# Patient Record
Sex: Male | Born: 1980 | Race: White | Hispanic: No | Marital: Single | Smoking: Current every day smoker
Health system: Southern US, Community
[De-identification: ages and names within clinical notes are randomized; demographics above are authoritative.]

## PROBLEM LIST (undated history)

## (undated) DIAGNOSIS — I10 Essential (primary) hypertension: Secondary | ICD-10-CM

## (undated) DIAGNOSIS — I7121 Aneurysm of the ascending aorta, without rupture: Secondary | ICD-10-CM

## (undated) HISTORY — DX: Essential (primary) hypertension: I10

## (undated) HISTORY — DX: Aneurysm of the ascending aorta, without rupture: I71.21

## (undated) HISTORY — PX: KNEE ARTHROSCOPY: SUR90

---
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
## 2001-03-06 ENCOUNTER — Ambulatory Visit (HOSPITAL_COMMUNITY): Admission: RE | Admit: 2001-03-06 | Discharge: 2001-03-06 | Payer: Self-pay | Admitting: Family Medicine

## 2001-03-06 ENCOUNTER — Encounter: Payer: Self-pay | Admitting: Family Medicine

## 2001-03-10 ENCOUNTER — Encounter: Payer: Self-pay | Admitting: Family Medicine

## 2001-03-10 ENCOUNTER — Encounter: Admission: RE | Admit: 2001-03-10 | Discharge: 2001-03-10 | Payer: Self-pay | Admitting: Family Medicine

## 2014-03-03 ENCOUNTER — Encounter (HOSPITAL_COMMUNITY): Payer: Self-pay | Admitting: Emergency Medicine

## 2014-03-03 ENCOUNTER — Emergency Department (HOSPITAL_COMMUNITY)
Admission: EM | Admit: 2014-03-03 | Discharge: 2014-03-03 | Disposition: A | Discharge status: Home / Self Care | Payer: Self-pay | Attending: Emergency Medicine | Admitting: Emergency Medicine

## 2014-03-03 DIAGNOSIS — H579 Unspecified disorder of eye and adnexa: Secondary | ICD-10-CM | POA: Insufficient documentation

## 2014-03-03 DIAGNOSIS — H5789 Other specified disorders of eye and adnexa: Secondary | ICD-10-CM | POA: Insufficient documentation

## 2014-03-03 DIAGNOSIS — L237 Allergic contact dermatitis due to plants, except food: Secondary | ICD-10-CM

## 2014-03-03 DIAGNOSIS — L255 Unspecified contact dermatitis due to plants, except food: Secondary | ICD-10-CM | POA: Insufficient documentation

## 2014-03-03 DIAGNOSIS — Z87891 Personal history of nicotine dependence: Secondary | ICD-10-CM | POA: Insufficient documentation

## 2014-03-03 MED ORDER — FAMOTIDINE 20 MG PO TABS: 20.0000 mg | ORAL_TABLET | Freq: Two times a day (BID) | ORAL | Status: DC

## 2014-03-03 MED ORDER — DIPHENHYDRAMINE HCL 25 MG PO TABS: 25.0000 mg | ORAL_TABLET | Freq: Four times a day (QID) | ORAL | Status: DC

## 2014-03-03 MED ORDER — PREDNISONE 20 MG PO TABS
60.0000 mg | ORAL_TABLET | Freq: Once | ORAL | Status: AC
  Administered 2014-03-03: 60 mg via ORAL
  Filled 2014-03-03: qty 3

## 2014-03-03 MED ORDER — PREDNISONE 20 MG PO TABS: 60.0000 mg | ORAL_TABLET | Freq: Every day | ORAL | Status: DC

## 2014-03-03 NOTE — Discharge Instructions (Signed)
Poison Ivy Poison ivy is a inflammation of the skin (contact dermatitis) caused by touching the allergens on the leaves of the ivy plant following previous exposure to the plant. The rash usually appears 48 hours after exposure. The rash is usually bumps (papules) or blisters (vesicles) in a linear pattern. Depending on your own sensitivity, the rash may simply cause redness and itching, or it may also progress to blisters which may break open. These must be well cared for to prevent secondary bacterial (germ) infection, followed by scarring. Keep any open areas dry, clean, dressed, and covered with an antibacterial ointment if needed. The eyes may also get puffy. The puffiness is worst in the morning and gets better as the day progresses. This dermatitis usually heals without scarring, within 2 to 3 weeks without treatment. HOME CARE INSTRUCTIONS  Thoroughly wash with soap and water as soon as you have been exposed to poison ivy. You have about one half hour to remove the plant resin before it will cause the rash. This washing will destroy the oil or antigen on the skin that is causing, or will cause, the rash. Be sure to wash under your fingernails as any plant resin there will continue to spread the rash. Do not rub skin vigorously when washing affected area. Poison ivy cannot spread if no oil from the plant remains on your body. A rash that has progressed to weeping sores will not spread the rash unless you have not washed thoroughly. It is also important to wash any clothes you have been wearing as these may carry active allergens. The rash will return if you wear the unwashed clothing, even several days later. Avoidance of the plant in the future is the best measure. Poison ivy plant can be recognized by the number of leaves. Generally, poison ivy has three leaves with flowering branches on a single stem. Diphenhydramine may be purchased over the counter and used as needed for itching. Do not drive with  this medication if it makes you drowsy.Ask your caregiver about medication for children. SEEK MEDICAL CARE IF:  Open sores develop.  Redness spreads beyond area of rash.  You notice purulent (pus-like) discharge.  You have increased pain.  Other signs of infection develop (such as fever). Document Released: 12/07/2000 Document Revised: 03/03/2012 Document Reviewed: 10/26/2009 ExitCare Patient Information 2014 [employer].  

## 2014-03-03 NOTE — ED Provider Notes (Signed)
CSN: [id number]     Arrival date & time 03/03/14  1048 History   First MD Initiated Contact with Patient 03/03/14 1058     Chief Complaint  Patient presents with  . Poison Ivy     (Consider location/radiation/quality/duration/timing/severity/associated sxs/prior Treatment) HPI Comments: Patient here today for poison ivy rash that appeared yesterday. Patient had chased his puppy through area with poison ivy, then picked up puppy which is how he speculates the rash spread to his face. He is now complaining of rash on arms bilaterally, upper back, neck, and face. When he awoke this morning his left eyelid was swollen shut. He has taken benadryl and used an anti-pruritic cream with little relief. He also stated he has not removed his contacts since the event for fear the rash might spread into eye. Denies fever, SOB, swelling of throat, nausea, and vomiting.   Patient is a 33 y.o. male presenting with poison ivy. The history is provided by the patient.  Poison Lajoyce Cornersvy This is a new problem. The current episode started yesterday. The problem has been gradually worsening. Associated symptoms include a rash. Pertinent negatives include no fever, nausea, sore throat or vomiting.    History reviewed. No pertinent past medical history. History reviewed. No pertinent past surgical history. No family history on file. History  Substance Use Topics  . Smoking status: Former Games developermoker  . Smokeless tobacco: Not on file  . Alcohol Use: No    Review of Systems  Constitutional: Negative for fever.  HENT: Negative for sore throat and trouble swallowing.   Eyes: Positive for redness and itching.       Itching and erythema limited to bilateral eyelids.   Respiratory: Negative for shortness of breath.   Gastrointestinal: Negative for nausea and vomiting.  Skin: Positive for rash.      Allergies  Poison ivy extract  Home Medications   Current Outpatient Rx  Name  Route  Sig  Dispense  Refill  .  Diphenhyd-Calamine-Benzyl Alc (IVAREST POISON IVY ITCH) 02-06-09.5 % CREA   Apply externally   Apply 1 application topically as needed (For poison ivy).         . diphenhydrAMINE (BENADRYL) 25 mg capsule   Oral   Take 25 mg by mouth every 4 (four) hours as needed for itching.          BP 122/83  Pulse 83  Temp(Src) 98 F (36.7 C) (Oral)  Resp 16  Wt 220 lb (99.791 kg)  SpO2 99% Physical Exam  Constitutional: He is oriented to person, place, and time. He appears well-developed and well-nourished. No distress.  HENT:  Mouth/Throat: Oropharynx is clear and moist.  Eyes: Conjunctivae are normal. Right eye exhibits no discharge. Left eye exhibits no discharge.  Cardiovascular: Normal rate and regular rhythm.   Pulmonary/Chest: Effort normal.  Neurological: He is alert and oriented to person, place, and time.  Skin: Skin is warm and dry. Rash noted. He is not diaphoretic.  Erythematous maculopapular rash to arms bilaterally, upper back, dorsal aspect of neck and face.    ED Course  Procedures (including critical care time) Labs Review Labs Reviewed - No data to display Imaging Review No results found.   EKG Interpretation None      MDM   Final diagnoses:  None    1. Poison Ivy  Contact dermatitis complicated by facial involvement. Will Rx oral steroids, continue benadryl. Return for worsening symptoms or eye symptoms.     Arnoldo HookerShari A Milani Lowenstein, PA-C  03/06/14 1058 

## 2014-03-03 NOTE — Progress Notes (Signed)
[facility] provided pt with a list of primary care resources. Patient stated that he was pending insurance through job.  °

## 2014-03-07 NOTE — ED Provider Notes (Signed)
Medical screening examination/treatment/procedure(s) were performed by non-physician practitioner and as supervising physician I was immediately available for consultation/collaboration.   EKG Interpretation None        Samayra Hebel, MD 03/07/14 0701 

## 2017-03-05 ENCOUNTER — Ambulatory Visit (INDEPENDENT_AMBULATORY_CARE_PROVIDER_SITE_OTHER): Payer: Self-pay

## 2017-03-05 ENCOUNTER — Other Ambulatory Visit: Payer: Self-pay | Admitting: Emergency Medicine

## 2017-03-05 DIAGNOSIS — M545 Low back pain: Secondary | ICD-10-CM

## 2017-03-05 DIAGNOSIS — M5431 Sciatica, right side: Secondary | ICD-10-CM

## 2018-01-15 DIAGNOSIS — J309 Allergic rhinitis, unspecified: Secondary | ICD-10-CM | POA: Diagnosis not present

## 2018-03-06 ENCOUNTER — Ambulatory Visit: Payer: BLUE CROSS/BLUE SHIELD | Admitting: Allergy & Immunology

## 2018-04-24 ENCOUNTER — Encounter: Payer: Self-pay | Admitting: Allergy & Immunology

## 2018-04-24 ENCOUNTER — Ambulatory Visit (INDEPENDENT_AMBULATORY_CARE_PROVIDER_SITE_OTHER): Payer: BLUE CROSS/BLUE SHIELD | Admitting: Allergy & Immunology

## 2018-04-24 VITALS — BP 130/90 | HR 76 | Temp 98.0°F | Resp 16 | Ht 70.5 in | Wt 241.0 lb

## 2018-04-24 DIAGNOSIS — J310 Chronic rhinitis: Secondary | ICD-10-CM

## 2018-04-24 MED ORDER — FLUTICASONE PROPIONATE 93 MCG/ACT NA EXHU: 2.0000 | INHALANT_SUSPENSION | Freq: Two times a day (BID) | NASAL | 5 refills | Status: DC

## 2018-04-24 MED ORDER — MONTELUKAST SODIUM 10 MG PO TABS: 10.0000 mg | ORAL_TABLET | Freq: Every day | ORAL | 5 refills | Status: AC

## 2018-04-24 NOTE — Patient Instructions (Addendum)
1. Chronic rhinitis - We will get blood testing to check on your allergies.  - We will call you in 1-2 weeks with the results, and we can start allergy shots at that point. - We may need to do skin testing to augment the blood testing, but we will see on that.  - Stop taking: Claritin-D (this is one of the weakest antihistamines) and Nasacort - Start taking: Xyzal (levocetirizine)  tablet once daily, Singulair (montelukast)  daily and Xhance (fluticasone) 1-2 sprays per nostril twice daily  - We will send in the script to the [facility] outpatient pharmacy, and they will call you to confirm your shipping address. - Ask to be enrolled in the auto-refill program so you can get a year for free.  - You can use an extra dose of the antihistamine, if needed, for breakthrough symptoms.  - Consider nasal saline rinses 1-2 times daily to remove allergens from the nasal cavities as well as help with mucous clearance (this is especially helpful to do before the nasal sprays are given) - Consider allergy shots as a means of long-term control. - Allergy shots "re-train" and "reset" the immune system to ignore environmental allergens and decrease the resulting immune response to those allergens (sneezing, itchy watery eyes, runny nose, nasal congestion, etc).    - Allergy shot consent signed today.   2. Return in about 3 months (around 07/25/2018).   Please inform us of any Emergency Department visits, hospitalizations, or changes in symptoms. Call us before going to the ED for breathing or allergy symptoms since we might be able to fit you in for a sick visit. Feel free to contact us anytime with any questions, problems, or concerns.  It was a pleasure to meet you today!  Websites that have reliable patient information: 1. American Academy of Asthma, Allergy, and Immunology: www.aaaai.org 2. Food Allergy Research and Education (FARE): foodallergy.org 3. Mothers of Asthmatics:  http://www.asthmacommunitynetwork.org 4. American College of Allergy, Asthma, and Immunology: www.acaai.org   Allergy Shots   Allergies are the result of a chain reaction that starts in the immune system. Your immune system controls how your body defends itself. For instance, if you have an allergy to pollen, your immune system identifies pollen as an invader or allergen. Your immune system overreacts by producing antibodies called Immunoglobulin E (IgE). These antibodies travel to cells that release chemicals, causing an allergic reaction.  The concept behind allergy immunotherapy, whether it is received in the form of shots or tablets, is that the immune system can be desensitized to specific allergens that trigger allergy symptoms. Although it requires time and patience, the payback can be long-term relief.  How Do Allergy Shots Work?  Allergy shots work much like a vaccine. Your body responds to injected amounts of a particular allergen given in increasing doses, eventually developing a resistance and tolerance to it. Allergy shots can lead to decreased, minimal or no allergy symptoms.  There generally are two phases: build-up and maintenance. Build-up often ranges from three to six months and involves receiving injections with increasing amounts of the allergens. The shots are typically given once or twice a week, though more rapid build-up schedules are sometimes used.  The maintenance phase begins when the most effective dose is reached. This dose is different for each person, depending on how allergic you are and your response to the build-up injections. Once the maintenance dose is reached, there are longer periods between injections, typically two to four weeks.  Occasionally doctors  give cortisone-type shots that can temporarily reduce allergy symptoms. These types of shots are different and should not be confused with allergy immunotherapy shots.  Who Can Be Treated with Allergy  Shots?  Allergy shots may be a good treatment approach for people with allergic rhinitis (hay fever), allergic asthma, conjunctivitis (eye allergy) or stinging insect allergy.   Before deciding to begin allergy shots, you should consider:  . The length of allergy season and the severity of your symptoms . Whether medications and/or changes to your environment can control your symptoms . Your desire to avoid long-term medication use . Time: allergy immunotherapy requires a major time commitment . Cost: may vary depending on your insurance coverage  Allergy shots for children age 34 and older are effective and often well tolerated. They might prevent the onset of new allergen sensitivities or the progression to asthma.  Allergy shots are not started on patients who are pregnant but can be continued on patients who become pregnant while receiving them. In some patients with other medical conditions or who take certain common medications, allergy shots may be of risk. It is important to mention other medications you talk to your allergist.   When Will I Feel Better?  Some may experience decreased allergy symptoms during the build-up phase. For others, it may take as long as 12 months on the maintenance dose. If there is no improvement after a year of maintenance, your allergist will discuss other treatment options with you.  If you aren't responding to allergy shots, it may be because there is not enough dose of the allergen in your vaccine or there are missing allergens that were not identified during your allergy testing. Other reasons could be that there are high levels of the allergen in your environment or major exposure to non-allergic triggers like tobacco smoke.  What Is the Length of Treatment?  Once the maintenance dose is reached, allergy shots are generally continued for three to five years. The decision to stop should be discussed with your allergist at that time. Some people may  experience a permanent reduction of allergy symptoms. Others may relapse and a longer course of allergy shots can be considered.  What Are the Possible Reactions?  The two types of adverse reactions that can occur with allergy shots are local and systemic. Common local reactions include very mild redness and swelling at the injection site, which can happen immediately or several hours after. A systemic reaction, which is less common, affects the entire body or a particular body system. They are usually mild and typically respond quickly to medications. Signs include increased allergy symptoms such as sneezing, a stuffy nose or hives.  Rarely, a serious systemic reaction called anaphylaxis can develop. Symptoms include swelling in the throat, wheezing, a feeling of tightness in the chest, nausea or dizziness. Most serious systemic reactions develop within 30 minutes of allergy shots. This is why it is strongly recommended you wait in your doctor's office for 30 minutes after your injections. Your allergist is trained to watch for reactions, and his or her staff is trained and equipped with the proper medications to identify and treat them.  Who Should Administer Allergy Shots?  The preferred location for receiving shots is your prescribing allergist's office. Injections can sometimes be given at another facility where the physician and staff are trained to recognize and treat reactions, and have received instructions by your prescribing allergist.

## 2018-04-24 NOTE — Progress Notes (Signed)
NEW PATIENT  Date of Service/Encounter:  04/24/18  Referring provider: Hoyt Koch, MD   Assessment:   Chronic rhinitis  Plan/Recommendations:   1. Chronic rhinitis - We will get blood testing to check on your allergies since you took your loratadine last night.  - We will call you in 1-2 weeks with the results, and we can start allergy shots at that point. - We may need to do skin testing to augment the blood testing, but we will see on that.  - Stop taking: Claritin-D (this is one of the weakest antihistamines) and Nasacort - Start taking: Xyzal (levocetirizine)  tablet once daily, Singulair (montelukast)  daily and Xhance (fluticasone) 1-2 sprays per nostril twice daily  - We will send in the script to the [facility] outpatient pharmacy, and they will call you to confirm your shipping address. - Ask to be enrolled in the auto-refill program so you can get a year for free.  - You can use an extra dose of the antihistamine, if needed, for breakthrough symptoms.  - Consider nasal saline rinses 1-2 times daily to remove allergens from the nasal cavities as well as help with mucous clearance (this is especially helpful to do before the nasal sprays are given) - Consider allergy shots as a means of long-term control. - Allergy shots "re-train" and "reset" the immune system to ignore environmental allergens and decrease the resulting immune response to those allergens (sneezing, itchy watery eyes, runny nose, nasal congestion, etc).    - Allergy shot consent signed today.   2. Return in about 3 months (around 07/25/2018).   Subjective:   Nicholas Fry is a 36 y.o. male presenting today for evaluation of  Chief Complaint  Patient presents with  . Allergic Rhinitis   . Itchy Eye    discharge  . Sinusitis    Nicholas Fry has a history of the following: Patient Active Problem List   Diagnosis Date Noted  . Chronic rhinitis 04/24/2018    History obtained from:  chart review and patient.  Nicholas Fry was referred by Hoyt Koch, MD.     Nicholas Fry is a 37 y.o. male presenting for an allergy evaluation. He started having symptoms 23 years ago when he moved here. He moved from [place]. He moved here when he was 37 years old, and he started having symptoms nearly immediately after he arrived. This is when he first came to our practice for allergy testing in the late 1990s. He is unsure of the results at that time.  In any case, overall symptoms have gradually been getting worse. 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 He does notice that he was completely asymptomatic when he is at the beach, but symptoms return within hours of coming back from the beach. He endorses itchy watery eyes. The right one has been worse lately, but overall they have affected both eyes. He also reports chronic nasal congestion. He forgot about the three days of being off of the antihistamines, and unfortunately he took a Claritin last night.   Nicholas Fry typically uses the Claritin-D. He jumps on and off of this D component depending on the severity. He does have Nasacort which he does not use routinely. He wears contacts so he does not use eye drops. He has been on Singulair for years, but he does not remember whether it worked. He has tried a mutltidue of  different medications. He is sometimes placed on antibiotics; he estimates that he gets around two rounds per year.  Otherwise, there is no history of other atopic diseases, including asthma, drug allergies, food allergies, stinging insect allergies, or urticaria. There is no significant infectious history. Vaccinations are up to date.    Past Medical History: Patient Active Problem List   Diagnosis Date Noted  . Chronic rhinitis 04/24/2018    Medication List:  Allergies as of 04/24/2018      Reactions   Poison Ivy Extract [poison Ivy  Extract] Hives, Rash      Medication List        Accurate as of 04/24/18 11:05 PM. Always use your most recent med list.          Fluticasone Propionate 93 MCG/ACT Exhu Commonly known as:  XHANCE Place 2 sprays into the nose 2 (two) times daily.   loratadine-pseudoephedrine 10-240 MG 24 hr tablet Commonly known as:  CLARITIN-D 24-hour Take 1 tablet by mouth daily.   montelukast 10 MG tablet Commonly known as:  SINGULAIR Take 1 tablet (10 mg total) by mouth at bedtime.   NASACORT ALLERGY 24HR 55 MCG/ACT Aero nasal inhaler Generic drug:  triamcinolone Place 2 sprays into the nose daily.       Birth History: non-contributory.  Developmental History: non-contributory.   Past Surgical History: History reviewed. No pertinent surgical history.   Family History: Family History  Problem Relation Age of Onset  . Allergic rhinitis Sister   . Asthma Neg Hx   . Angioedema Neg Hx   . Urticaria Neg Hx   . Eczema Neg Hx      Social History: Deandrae lives at home with his family. He works as a Naval architect. He is an occasional smoker, but not a heavy drinker. There are dogs in the home.      Review of Systems: a 14-point review of systems is pertinent for what is mentioned in HPI.  Otherwise, all other systems were negative. Constitutional: negative other than that listed in the HPI Eyes: negative other than that listed in the HPI Ears, nose, mouth, throat, and face: negative other than that listed in the HPI Respiratory: negative other than that listed in the HPI Cardiovascular: negative other than that listed in the HPI Gastrointestinal: negative other than that listed in the HPI Genitourinary: negative other than that listed in the HPI Integument: negative other than that listed in the HPI Hematologic: negative other than that listed in the HPI Musculoskeletal: negative other than that listed in the HPI Neurological: negative other than that listed in the  HPI Allergy/Immunologic: negative other than that listed in the HPI    Objective:   Blood pressure 130/90, pulse 76, temperature 98 F (36.7 C), temperature source Oral, resp. rate 16, height 5' 10.5" (1.791 m), weight 241 lb (109.3 kg). Body mass index is 34.09 kg/m.   Physical Exam:  General: Alert, interactive, in no acute distress. Pleasant male.  Eyes: No conjunctival injection bilaterally, no discharge on the right, no discharge on the left, no Horner-Trantas dots present and allergic shiners present bilaterally. PERRL bilaterally. EOMI without pain. No photophobia.  Ears: Right TM pearly gray with normal light reflex, Left TM pearly gray with normal light reflex, Right TM intact without perforation and Left TM intact without perforation.  Nose/Throat: External nose within normal limits, nasal crease present and septum midline. Turbinates markedly edematous and pale with clear discharge. Posterior oropharynx markedly erythematous with cobblestoning in the  posterior oropharynx. Tonsils 3+ without exudates.  Tongue without thrush. Neck: Supple without thyromegaly. Trachea midline. Adenopathy: no enlarged lymph nodes appreciated in the anterior cervical, occipital, axillary, epitrochlear, inguinal, or popliteal regions. Lungs: Clear to auscultation without wheezing, rhonchi or rales. No increased work of breathing. CV: Normal S1/S2. No murmurs. Capillary refill <2 seconds.  Abdomen: Nondistended, nontender. No guarding or rebound tenderness. Bowel sounds present in all fields and hypoactive  Skin: Warm and dry, without lesions or rashes. Extremities:  No clubbing, cyanosis or edema. Neuro:   Grossly intact. No focal deficits appreciated. Responsive to questions.  Diagnostic studies: deferred due to recent antihistamine use     Malachi Bonds, MD [facility]

## 2018-04-29 ENCOUNTER — Telehealth: Payer: Self-pay | Admitting: Allergy & Immunology

## 2018-04-29 NOTE — Telephone Encounter (Signed)
Patient informed and advised of medication recommendations. Will let us know if that works well or not.

## 2018-04-29 NOTE — Telephone Encounter (Signed)
Patient is calling about issues with medications XYZAL and MONTELUKAST 1)) patient cannot take medications at the same time daily or take them daily due to work schedule 2))  patient states that meds are making him drowsy - and patient drives truck for work  Please advise

## 2018-04-29 NOTE — Telephone Encounter (Signed)
Reviewed noted. Let's change him to Allegra  tablet in lieu of the Xyzal. I bet the Xyzal is causing the sleepiness.   Malachi Bonds, MD [facility]

## 2018-04-29 NOTE — Telephone Encounter (Signed)
Dr. Dellis Anes can you make any suggestions.

## 2018-05-03 LAB — IGE+ALLERGENS ZONE 2(30)
Amer Sycamore IgE Qn: 0.76 kU/L — AB
Aspergillus Fumigatus IgE: <0.1 kU/L
Bahia Grass IgE: 15.4 kU/L — AB
Bermuda Grass IgE: 9.25 kU/L — AB
Cladosporium Herbarum IgE: <0.1 kU/L
Cockroach, American IgE: 0.29 kU/L — AB
Common Silver Birch IgE: 0.69 kU/L — AB
D Pteronyssinus IgE: 0.24 kU/L — AB
D002-IGE D FARINAE: 0.32 kU/L — AB
E005-IGE DOG DANDER: 0.12 kU/L — AB
Elm, American IgE: 0.8 kU/L — AB
IGE (IMMUNOGLOBULIN E), SERUM: 104 [IU]/mL (ref 6–495)
Johnson Grass IgE: 13 kU/L — AB
M004-IGE MUCOR RACEMOSUS: 0.66 kU/L — AB
Maple/Box Elder IgE: 1.26 kU/L — AB
Mugwort IgE Qn: 0.78 kU/L — AB
Oak, White IgE: 0.72 kU/L — AB
Penicillium Chrysogen IgE: <0.1 kU/L
Pigweed, Rough IgE: 0.73 kU/L — AB
Plantain, English IgE: 0.84 kU/L — AB
SWEET GUM IGE RAST QL: 0.8 kU/L — AB
Sheep Sorrel IgE Qn: 0.85 kU/L — AB
T006-IGE CEDAR, MOUNTAIN: 4.02 kU/L — AB
T041-IGE HICKORY, WHITE: 2.21 kU/L — AB
TIMOTHY IGE: 15.9 kU/L — AB
W001-IGE RAGWEED, SHORT: 5.44 kU/L — AB
W020-IGE NETTLE: 0.81 kU/L — AB
WHITE MULBERRY IGE: 0.62 kU/L — AB

## 2018-05-11 NOTE — Addendum Note (Signed)
Addended by: Alfonse Spruce on: 05/11/2018 09:26 AM   Modules accepted: Orders

## 2018-05-11 NOTE — Progress Notes (Addendum)
We received notification from Nicholas Fry that he is interested in pursuing allergen immunotherapy. Prescriptions written and routed to the Immunotherapy Team.   Nicholas Bonds, MD [facility]

## 2018-05-13 ENCOUNTER — Ambulatory Visit: Payer: BLUE CROSS/BLUE SHIELD

## 2018-05-14 DIAGNOSIS — J301 Allergic rhinitis due to pollen: Secondary | ICD-10-CM | POA: Diagnosis not present

## 2018-05-14 NOTE — Progress Notes (Signed)
VIALS EXP 05-15-19 

## 2018-05-16 DIAGNOSIS — J3089 Other allergic rhinitis: Secondary | ICD-10-CM | POA: Diagnosis not present

## 2018-05-22 ENCOUNTER — Ambulatory Visit (INDEPENDENT_AMBULATORY_CARE_PROVIDER_SITE_OTHER): Payer: BLUE CROSS/BLUE SHIELD | Admitting: *Deleted

## 2018-05-22 DIAGNOSIS — J309 Allergic rhinitis, unspecified: Secondary | ICD-10-CM

## 2018-05-22 NOTE — Progress Notes (Signed)
Immunotherapy   Patient Details  Name: Nicholas Fry MRN: [id number] Date of Birth: [birth year]  05/22/2018  Nicholas Fry started injections for  Dmite-CR-Mold-RW & Grass-Weed-Tree-Dog. Following schedule: B  Frequency:2 times per week Epi-Pen:Epi-Pen Available  Consent signed and patient instructions given. No problems after 30 minutes in the office.   Nicholas Fry 05/22/2018, 2:30 PM

## 2018-06-02 ENCOUNTER — Ambulatory Visit (INDEPENDENT_AMBULATORY_CARE_PROVIDER_SITE_OTHER): Payer: BLUE CROSS/BLUE SHIELD | Admitting: *Deleted

## 2018-06-02 DIAGNOSIS — J309 Allergic rhinitis, unspecified: Secondary | ICD-10-CM

## 2018-06-09 ENCOUNTER — Ambulatory Visit (INDEPENDENT_AMBULATORY_CARE_PROVIDER_SITE_OTHER): Payer: BLUE CROSS/BLUE SHIELD

## 2018-06-09 DIAGNOSIS — J309 Allergic rhinitis, unspecified: Secondary | ICD-10-CM | POA: Diagnosis not present

## 2018-06-11 ENCOUNTER — Ambulatory Visit (INDEPENDENT_AMBULATORY_CARE_PROVIDER_SITE_OTHER): Payer: BLUE CROSS/BLUE SHIELD | Admitting: *Deleted

## 2018-06-11 DIAGNOSIS — J309 Allergic rhinitis, unspecified: Secondary | ICD-10-CM | POA: Diagnosis not present

## 2018-06-16 ENCOUNTER — Ambulatory Visit (INDEPENDENT_AMBULATORY_CARE_PROVIDER_SITE_OTHER): Payer: BLUE CROSS/BLUE SHIELD | Admitting: *Deleted

## 2018-06-16 DIAGNOSIS — J309 Allergic rhinitis, unspecified: Secondary | ICD-10-CM | POA: Diagnosis not present

## 2018-06-23 ENCOUNTER — Ambulatory Visit (INDEPENDENT_AMBULATORY_CARE_PROVIDER_SITE_OTHER): Payer: BLUE CROSS/BLUE SHIELD | Admitting: *Deleted

## 2018-06-23 DIAGNOSIS — J309 Allergic rhinitis, unspecified: Secondary | ICD-10-CM | POA: Diagnosis not present

## 2018-06-30 ENCOUNTER — Ambulatory Visit (INDEPENDENT_AMBULATORY_CARE_PROVIDER_SITE_OTHER): Payer: BLUE CROSS/BLUE SHIELD

## 2018-06-30 DIAGNOSIS — J309 Allergic rhinitis, unspecified: Secondary | ICD-10-CM

## 2018-08-13 DIAGNOSIS — J310 Chronic rhinitis: Secondary | ICD-10-CM | POA: Diagnosis not present

## 2018-08-13 DIAGNOSIS — R0789 Other chest pain: Secondary | ICD-10-CM | POA: Diagnosis not present

## 2018-08-23 ENCOUNTER — Other Ambulatory Visit: Payer: Self-pay | Admitting: Allergy & Immunology

## 2018-08-26 DIAGNOSIS — J3089 Other allergic rhinitis: Secondary | ICD-10-CM | POA: Diagnosis not present

## 2018-08-26 DIAGNOSIS — J343 Hypertrophy of nasal turbinates: Secondary | ICD-10-CM | POA: Diagnosis not present

## 2018-08-26 DIAGNOSIS — J342 Deviated nasal septum: Secondary | ICD-10-CM | POA: Diagnosis not present

## 2018-08-26 DIAGNOSIS — J329 Chronic sinusitis, unspecified: Secondary | ICD-10-CM | POA: Diagnosis not present

## 2018-10-07 IMAGING — DX DG LUMBAR SPINE COMPLETE 4+V
5 series · 5 of 5 positions shown · non-contrast
Comparison: None.

CLINICAL DATA: Low back pain since a lifting injury 03/01/2017. The
patient heard a pop at the time of the injury. Initial encounter.

EXAM:
LUMBAR SPINE - COMPLETE 4+ VIEW

[l-spine ap]
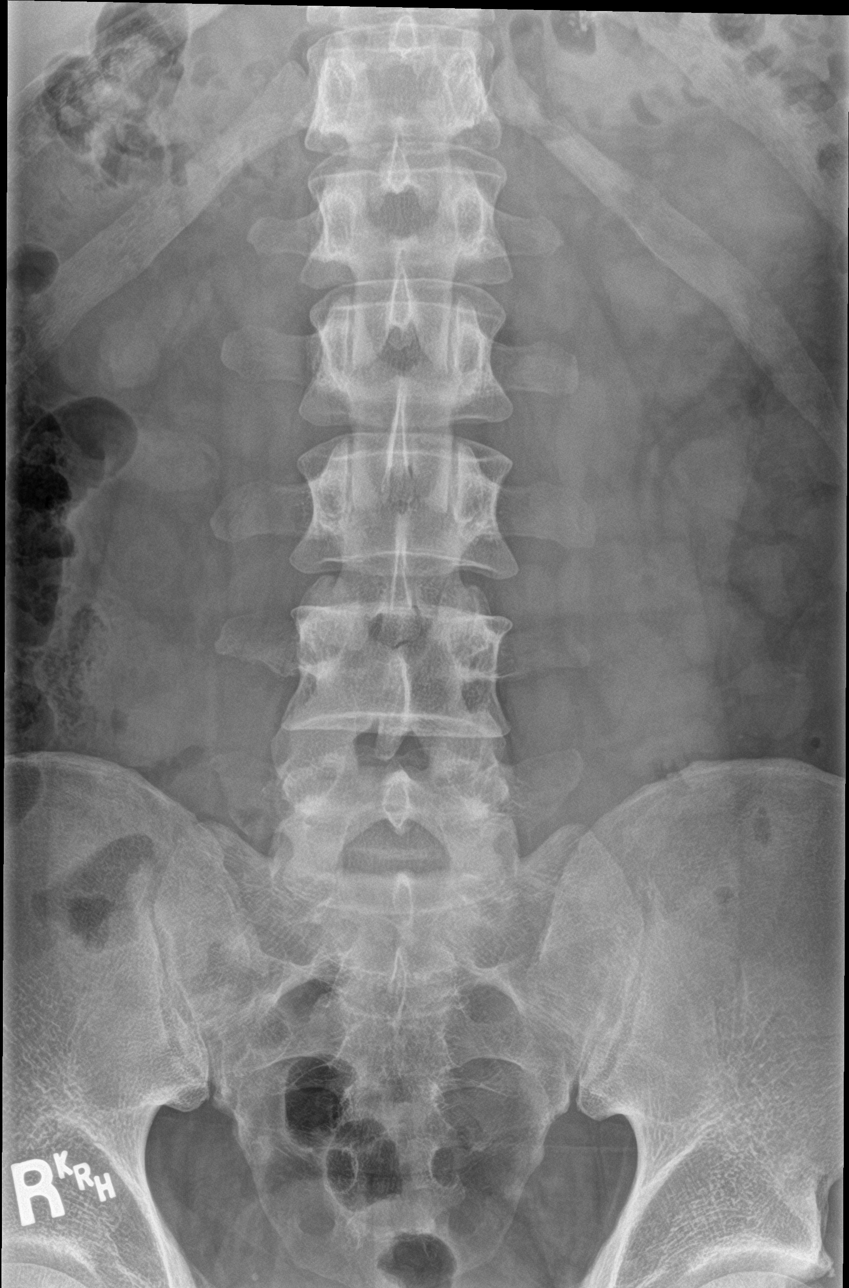

[l-spine obl (1 of 2)]
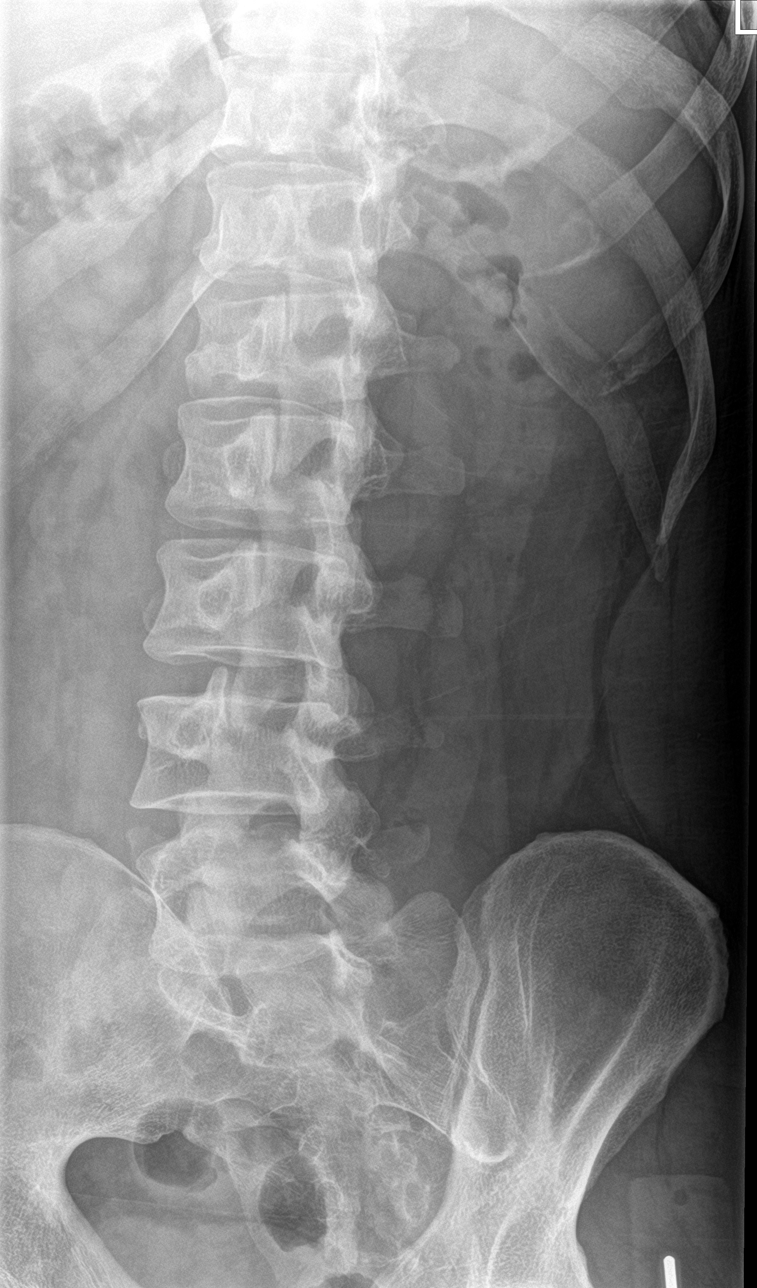

[l-spine obl (2 of 2)]
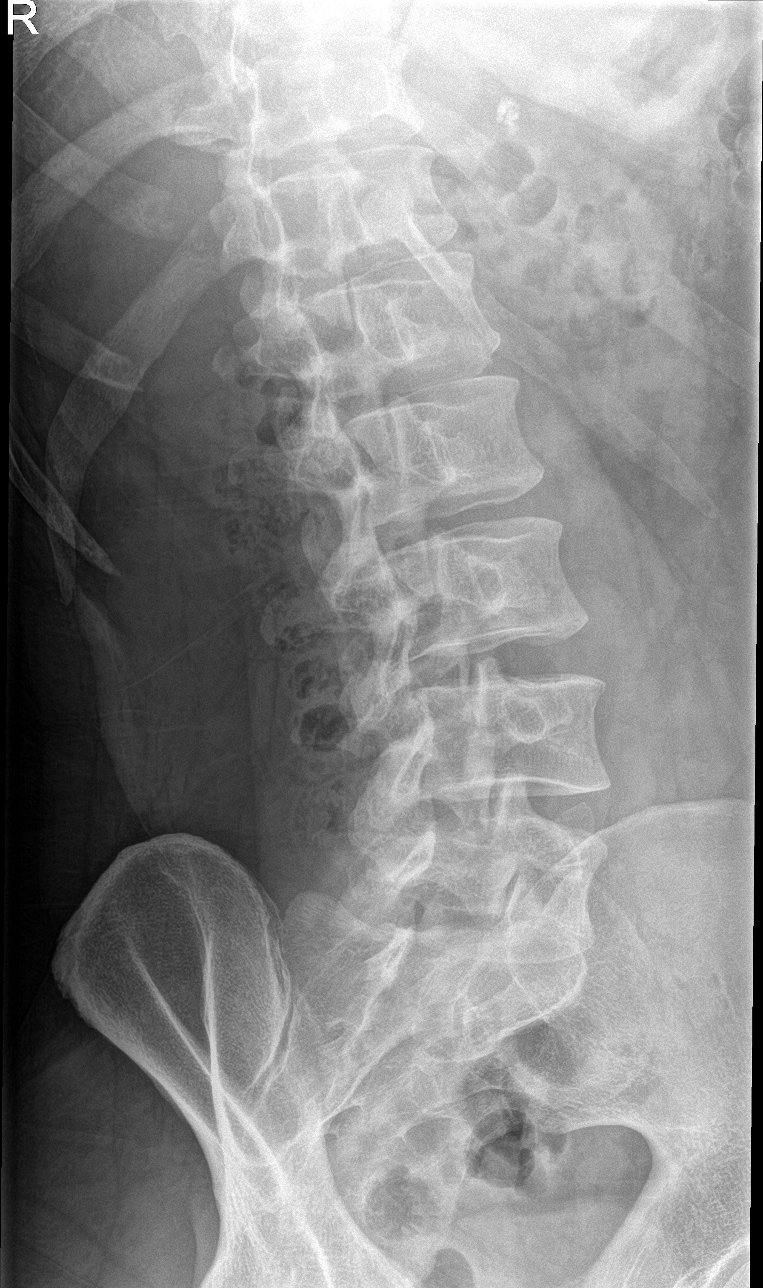

[l-spine lat]
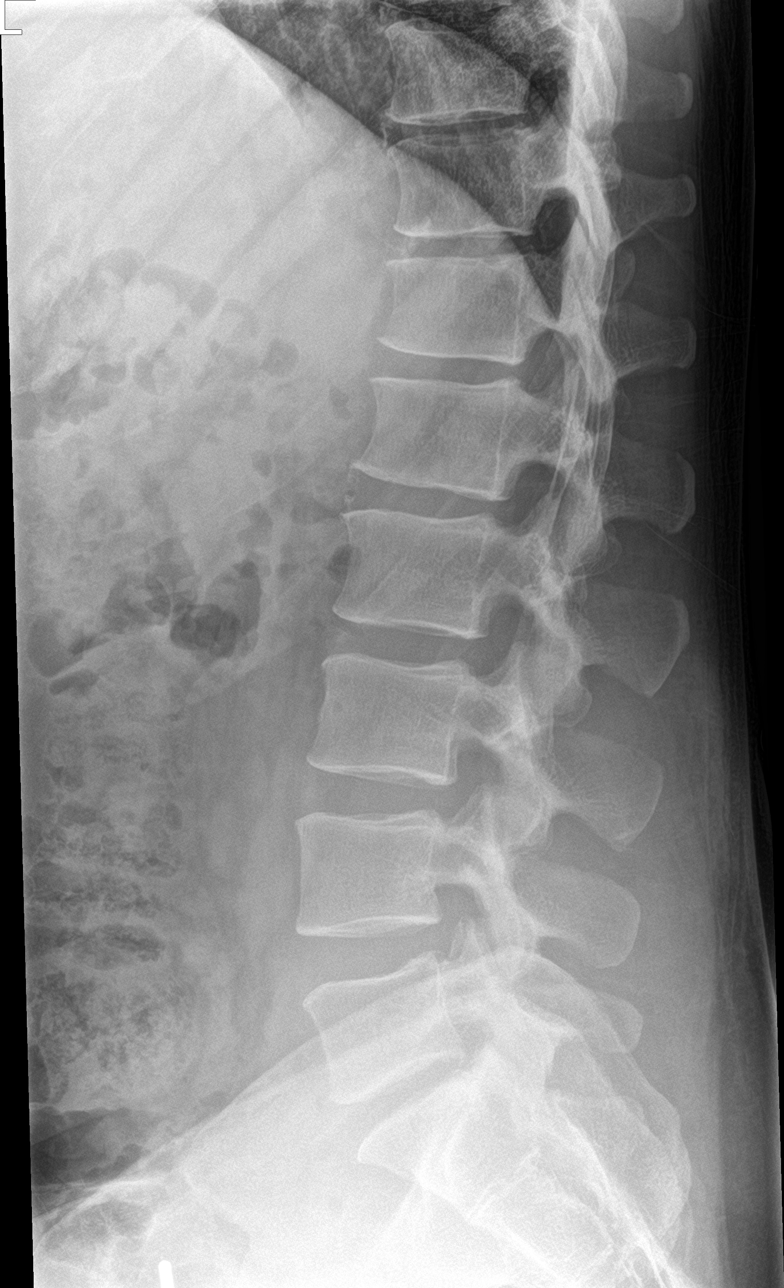

[l-spine spot]
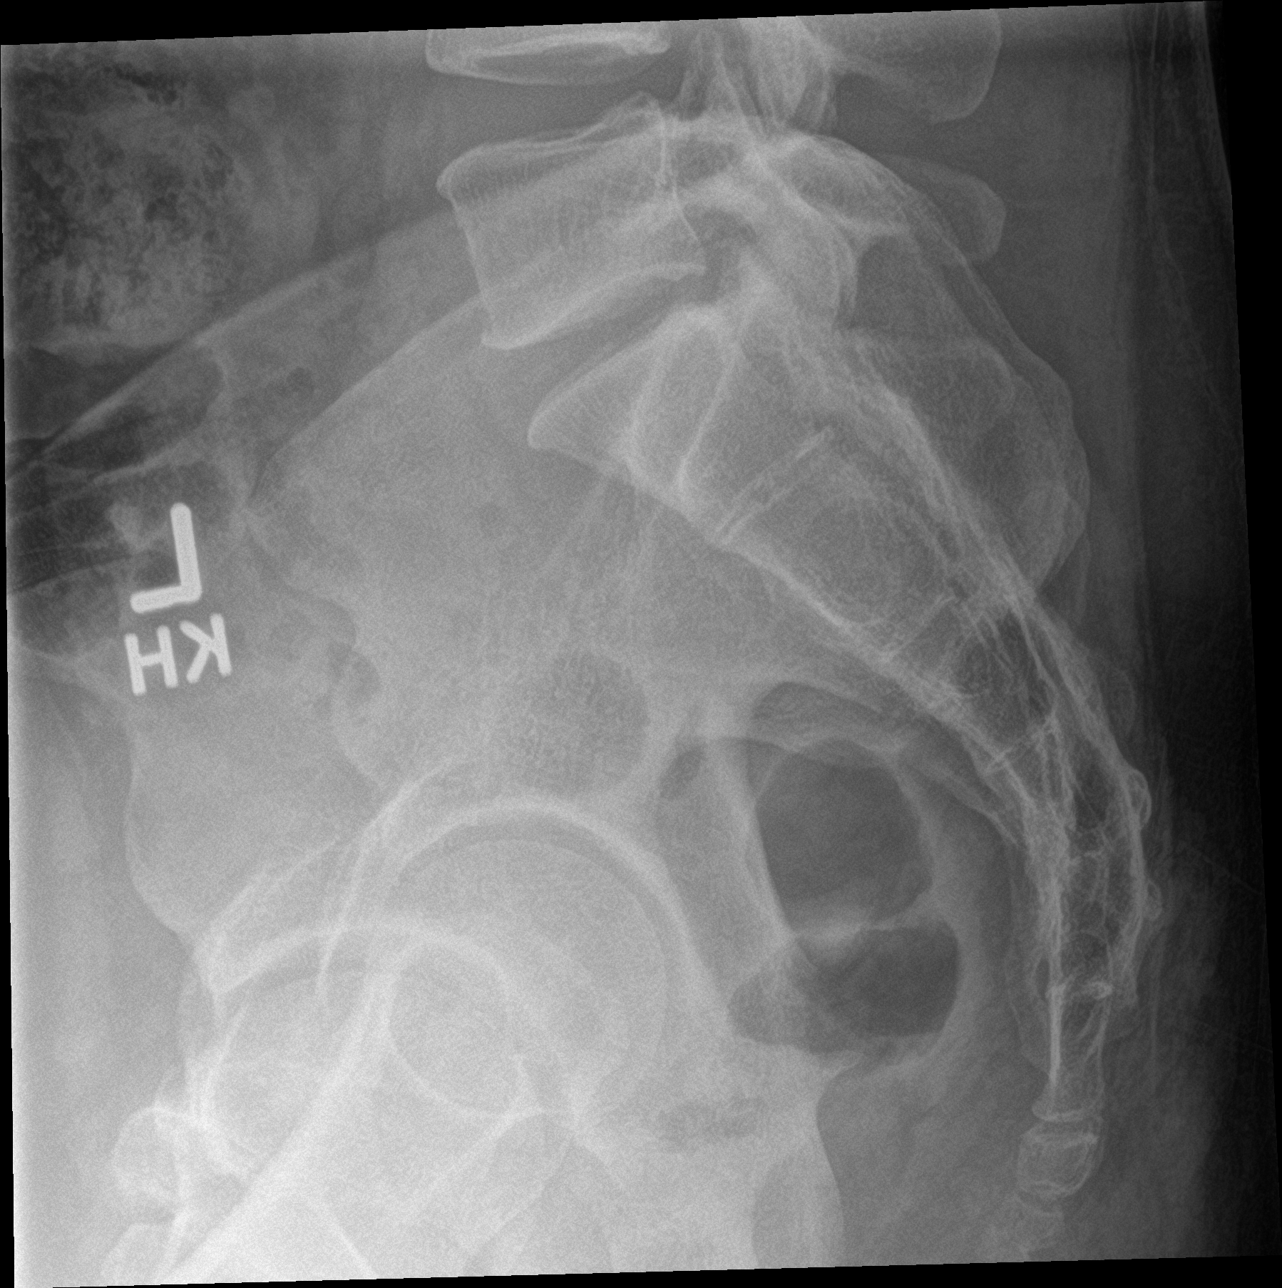

[5 of 5 positions shown; findings below may reference images not displayed]

FINDINGS: There is no evidence of lumbar spine fracture. Alignment is normal.
Intervertebral disc spaces are maintained.
IMPRESSION: Normal exam.

## 2018-10-29 ENCOUNTER — Other Ambulatory Visit: Payer: Self-pay | Admitting: Allergy & Immunology

## 2018-10-29 NOTE — Telephone Encounter (Signed)
OK refill Xhance.  Per chart, OV 04/24/2018, Return in about 3 months (around 07/25/2018).  Can give refill up to 12 months for allergic rhinitis.

## 2019-02-12 DIAGNOSIS — J069 Acute upper respiratory infection, unspecified: Secondary | ICD-10-CM | POA: Diagnosis not present

## 2019-05-12 DIAGNOSIS — J0101 Acute recurrent maxillary sinusitis: Secondary | ICD-10-CM | POA: Diagnosis not present

## 2019-10-20 ENCOUNTER — Other Ambulatory Visit: Payer: Self-pay

## 2019-10-20 DIAGNOSIS — J069 Acute upper respiratory infection, unspecified: Secondary | ICD-10-CM | POA: Diagnosis not present

## 2019-10-20 DIAGNOSIS — Z20822 Contact with and (suspected) exposure to covid-19: Secondary | ICD-10-CM

## 2019-10-20 DIAGNOSIS — Z20828 Contact with and (suspected) exposure to other viral communicable diseases: Secondary | ICD-10-CM | POA: Diagnosis not present

## 2019-10-21 LAB — NOVEL CORONAVIRUS, NAA: SARS-CoV-2, NAA: DETECTED — AB

## 2024-01-02 ENCOUNTER — Ambulatory Visit: Payer: Self-pay | Admitting: Internal Medicine

## 2024-01-03 ENCOUNTER — Encounter: Payer: Self-pay | Admitting: Internal Medicine

## 2024-01-03 ENCOUNTER — Ambulatory Visit: Payer: BC Managed Care – PPO | Attending: Internal Medicine | Admitting: Internal Medicine

## 2024-01-03 VITALS — BP 108/84 | HR 101 | Ht 72.0 in | Wt 237.0 lb

## 2024-01-03 DIAGNOSIS — F1010 Alcohol abuse, uncomplicated: Secondary | ICD-10-CM | POA: Insufficient documentation

## 2024-01-03 DIAGNOSIS — I10 Essential (primary) hypertension: Secondary | ICD-10-CM | POA: Diagnosis not present

## 2024-01-03 DIAGNOSIS — R079 Chest pain, unspecified: Secondary | ICD-10-CM | POA: Diagnosis not present

## 2024-01-03 DIAGNOSIS — Z720 Tobacco use: Secondary | ICD-10-CM | POA: Diagnosis not present

## 2024-01-03 DIAGNOSIS — I7121 Aneurysm of the ascending aorta, without rupture: Secondary | ICD-10-CM | POA: Insufficient documentation

## 2024-01-03 NOTE — Progress Notes (Signed)
 Cardiology Office Note  Date: 01/03/2024   ID: JAC ROMULUS, DOB 1981/04/17, MRN [id number]  PCP:  Camille Galloway, MD (Inactive)  Cardiologist:  None Electrophysiologist:  None   History of Present Illness: Nicholas Fry is a 43 y.o. male known to have recent diagnosis of HTN, ascending aortic aneurysm 3.9 cm in 11/2023, nicotine abuse, alcohol abuse was referred to cardiology clinic for evaluation of chest pain.  Patient had 3 ER visits in December 2024 for elevated BP 210/160 mmHg, chest pains radiating to his left arm with tingling in his fingers and palpitations.  He was started on amlodipine  5 mg once daily in the last week of December 2024 after which his blood pressures are very well-controlled.  I reviewed his home BP log which showed BP range between 110 and 130 mmHg.  After his BPs are controlled, he had 1 episode of chest pain radiating to his left arm with tingling.  He is also heavy smoker, started to smoke when he was 43 years old, 1 and half pack of cigarettes daily until recently he cut back to 1 pack a day.  He also drinks hard liquor every other day, 2 to 3 glasses.  Does not like to drink a beer or wine.  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  He denies having any symptoms of DOE, orthopnea, PND, dizziness, syncope, leg swelling.  He does have palpitations but it was in the setting of elevated BP.  He takes caffeine products and significantly cut back in the last 2 weeks.  He does have a PCP who he follows up with and stated that his blood pressure has always been normal at his doctor's office until recently.   Current Outpatient Medications  Medication Sig Dispense Refill   Fluticasone  Propionate (XHANCE ) 93 MCG/ACT EXHU Place 1 spray into both nostrils 2 (two) times daily as needed (may use 1-2 sprays each nostril twice a day.). 32 mL 5   loratadine-pseudoephedrine (CLARITIN-D 24-HOUR) 10-240 MG 24  hr tablet Take 1 tablet by mouth daily.     montelukast  (SINGULAIR ) 10 MG tablet Take 1 tablet (10 mg total) by mouth at bedtime. 30 tablet 5   triamcinolone (NASACORT ALLERGY 24HR) 55 MCG/ACT AERO nasal inhaler Place 2 sprays into the nose daily.     No current facility-administered medications for this visit.   Allergies:  Poison ivy extract [poison ivy extract]   Social History: The patient  reports that he has been smoking. He has never used smokeless tobacco. He reports that he does not drink alcohol and does not use drugs.   Family History: The patient's family history includes Allergic rhinitis in his sister.   ROS:  Please see the history of present illness. Otherwise, complete review of systems is positive for none.  All other systems are reviewed and negative.   Physical Exam: VS:  There were no vitals taken for this visit., BMI There is no height or weight on file to calculate BMI.  Wt Readings from Last 3 Encounters:  04/24/18 241 lb (109.3 kg)  03/03/14 220 lb (99.8 kg)    General: Patient appears comfortable at rest. HEENT: Conjunctiva and lids normal, oropharynx clear with moist mucosa. Neck: Supple, no elevated JVP or carotid bruits, no thyromegaly. Lungs: Clear to auscultation, nonlabored breathing at rest. Cardiac: Regular rate and rhythm, no S3 or significant systolic murmur, no pericardial rub. Abdomen: Soft, nontender,  no hepatomegaly, bowel sounds present, no guarding or rebound. Extremities: No pitting edema, distal pulses 2+. Skin: Warm and dry. Musculoskeletal: No kyphosis. Neuropsychiatric: Alert and oriented x3, affect grossly appropriate.  Recent Labwork: No results found for requested labs within last 365 days.  No results found for: CHOL, TRIG, HDL, CHOLHDL, VLDL, LDLCALC, LDLDIRECT   Assessment and Plan:  Chest pain HTN, controlled Ascending thoracic aortic aneurysm 3.9 cm in 12/24 Nicotine abuse Alcohol abuse   -Patient had 3  ER visits in 11/2023 for newly diagnosed HTN with BP 210/160 mm Hg associated with chest pain radiating to his left arm and tingling but after starting amlodipine  5 mg once daily, home BPs adequately controlled (SBP ranging between 110 and 130 mmHg).  But he still had 1 episode of chest pain radiating to his left arm with tingling in his fingers even with controlled blood pressures last week.  Will obtain exercise Myoview  and echocardiogram.  Will continue amlodipine  5 mg once daily.  CT angio chest was performed in the ER that showed 3.9 cm ascending thoracic aortic aneurysm.  This is normal diameter for his height, 6 feet.  He is currently out of work on [employer] until he is cleared by doctor.  I will see him back in 2 to 3 weeks after echo and exercise Myoview  are resulted.  I also discussed with him about smoking cessation and quitting alcohol.  He verbalized understanding.  No symptoms of claudication in the lower extremities.    Medication Adjustments/Labs and Tests Ordered: Current medicines are reviewed at length with the patient today.  Concerns regarding medicines are outlined above.    Disposition:  Follow up 2 to 3 weeks after echo and stress test are resulted  Signed, Nicholas Papp Arleta Maywood, MD, 01/03/2024 9:14 AM    [facility] at [facility] [address], [place], [place] [address]

## 2024-01-03 NOTE — Patient Instructions (Signed)
 Medication Instructions:  Your physician recommends that you continue on your current medications as directed. Please refer to the Current Medication list given to you today.  *If you need a refill on your cardiac medications before your next appointment, please call your pharmacy*   Lab Work: None If you have labs (blood work) drawn today and your tests are completely normal, you will receive your results only by: MyChart Message (if you have MyChart) OR A paper copy in the mail If you have any lab test that is abnormal or we need to change your treatment, we will call you to review the results.   Testing/Procedures: Your physician has requested that you have an echocardiogram. Echocardiography is a painless test that uses sound waves to create images of your heart. It provides your doctor with information about the size and shape of your heart and how well your heart's chambers and valves are working. This procedure takes approximately one hour. There are no restrictions for this procedure. Please do NOT wear cologne, perfume, aftershave, or lotions (deodorant is allowed). Please arrive 15 minutes prior to your appointment time.  Please note: We ask at that you not bring children with you during ultrasound (echo/ vascular) testing. Due to room size and safety concerns, children are not allowed in the ultrasound rooms during exams. Our front office staff cannot provide observation of children in our lobby area while testing is being conducted. An adult accompanying a patient to their appointment will only be allowed in the ultrasound room at the discretion of the ultrasound technician under special circumstances. We apologize for any inconvenience.   Your physician has requested that you have a lexiscan  myoview . For further information please visit [email or web address] Please follow instruction sheet, as given.    Follow-Up: At [facility], you and your health needs are our  priority.  As part of our continuing mission to provide you with exceptional heart care, we have created designated Provider Care Teams.  These Care Teams include your primary Cardiologist (physician) and Advanced Practice Providers (APPs -  Physician Assistants and Nurse Practitioners) who all work together to provide you with the care you need, when you need it.  We recommend signing up for the patient portal called MyChart.  Sign up information is provided on this After Visit Summary.  MyChart is used to connect with patients for Virtual Visits (Telemedicine).  Patients are able to view lab/test results, encounter notes, upcoming appointments, etc.  Non-urgent messages can be sent to your provider as well.   To learn more about what you can do with MyChart, go to [email or web address].    Your next appointment:   2-3 week(s) after testing  Provider:   You may see Vishnu Mallipeddi, MD or one of the following Advanced Practice Providers on your designated Care Team:   Brittany Strader, PA-C  Olivia Pavy, [place]     Other Instructions

## 2024-01-13 ENCOUNTER — Ambulatory Visit: Payer: Self-pay | Admitting: Internal Medicine

## 2024-01-15 ENCOUNTER — Telehealth: Payer: Self-pay | Admitting: Internal Medicine

## 2024-01-15 ENCOUNTER — Encounter (HOSPITAL_COMMUNITY)
Admission: RE | Admit: 2024-01-15 | Discharge: 2024-01-15 | Disposition: A | Discharge status: Home / Self Care | Payer: BC Managed Care – PPO | Source: Ambulatory Visit | Attending: Internal Medicine | Admitting: Internal Medicine

## 2024-01-15 ENCOUNTER — Ambulatory Visit (HOSPITAL_COMMUNITY)
Admission: RE | Admit: 2024-01-15 | Discharge: 2024-01-15 | Disposition: A | Discharge status: Home / Self Care | Payer: BC Managed Care – PPO | Source: Ambulatory Visit | Attending: Internal Medicine | Admitting: Internal Medicine

## 2024-01-15 ENCOUNTER — Encounter (HOSPITAL_COMMUNITY): Payer: Self-pay

## 2024-01-15 DIAGNOSIS — Z0279 Encounter for issue of other medical certificate: Secondary | ICD-10-CM

## 2024-01-15 DIAGNOSIS — R079 Chest pain, unspecified: Secondary | ICD-10-CM | POA: Insufficient documentation

## 2024-01-15 LAB — ECHOCARDIOGRAM COMPLETE
AR max vel: 4.42 cm2
AV Area VTI: 4.46 cm2
AV Area mean vel: 4.61 cm2
AV Mean grad: 4 mm[Hg]
AV Peak grad: 7 mm[Hg]
Ao pk vel: 1.32 m/s
Area-P 1/2: 3.91 cm2
S' Lateral: 2.7 cm

## 2024-01-15 LAB — NM MYOCAR MULTI W/SPECT W/WALL MOTION / EF
LV dias vol: 104 mL (ref 62–150)
LV sys vol: 38 mL
Nuc Stress EF: 63 %
Peak HR: 125 {beats}/min
RATE: 0.4
Rest HR: 90 {beats}/min
Rest Nuclear Isotope Dose: 11 mCi
SDS: 0
SRS: 0
SSS: 0
ST Depression (mm): 0 mm
Stress Nuclear Isotope Dose: 33 mCi
TID: 1.1

## 2024-01-15 MED ORDER — SODIUM CHLORIDE FLUSH 0.9 % IV SOLN
INTRAVENOUS | Status: AC
  Administered 2024-01-15: 10 mL via INTRAVENOUS
  Filled 2024-01-15: qty 10

## 2024-01-15 MED ORDER — TECHNETIUM TC 99M TETROFOSMIN IV KIT
30.0000 | PACK | Freq: Once | INTRAVENOUS | Status: AC | PRN
  Administered 2024-01-15: 33 via INTRAVENOUS

## 2024-01-15 MED ORDER — REGADENOSON 0.4 MG/5ML IV SOLN
INTRAVENOUS | Status: AC
  Administered 2024-01-15: 0.4 mg via INTRAVENOUS
  Filled 2024-01-15: qty 5

## 2024-01-15 MED ORDER — TECHNETIUM TC 99M TETROFOSMIN IV KIT
10.0000 | PACK | Freq: Once | INTRAVENOUS | Status: AC | PRN
  Administered 2024-01-15: 11 via INTRAVENOUS

## 2024-01-15 NOTE — Progress Notes (Signed)
*  PRELIMINARY RESULTS* Echocardiogram 2D Echocardiogram has been performed.  Nicholas Fry 01/15/2024, 9:11 AM

## 2024-01-15 NOTE — Telephone Encounter (Signed)
Forms received from patient 01/15/2024, fee received. Billing form filled out and faxed to billing. Authorization form and blank FMLA forms uploaded to chart. Given to nurse for completion by MD.  Waldon Merl 01/15/2024

## 2024-01-17 ENCOUNTER — Other Ambulatory Visit: Payer: BC Managed Care – PPO

## 2024-01-17 ENCOUNTER — Other Ambulatory Visit: Payer: Self-pay

## 2024-01-17 ENCOUNTER — Telehealth: Payer: Self-pay | Admitting: Internal Medicine

## 2024-01-17 DIAGNOSIS — R002 Palpitations: Secondary | ICD-10-CM

## 2024-01-17 MED ORDER — AMLODIPINE BESYLATE 5 MG PO TABS: 5.0000 mg | ORAL_TABLET | Freq: Every day | ORAL | 3 refills | Status: AC

## 2024-01-17 MED ORDER — HYDRALAZINE HCL 25 MG PO TABS: 25.0000 mg | ORAL_TABLET | Freq: Every day | ORAL | 0 refills | Status: DC | PRN

## 2024-01-17 NOTE — Telephone Encounter (Signed)
Checking percert on the following   14 day zio-palpitations-VM

## 2024-01-30 ENCOUNTER — Telehealth: Payer: Self-pay

## 2024-01-30 NOTE — Telephone Encounter (Signed)
 Received email from [facility] which stated:  patient intentionally removed the device 9 days into the wear time and we were unable to help the patient reapply the device successfully.   Monitor data will be sent when pt returns monitor to [facility]

## 2024-02-05 ENCOUNTER — Ambulatory Visit: Payer: BC Managed Care – PPO | Admitting: Nurse Practitioner

## 2024-02-22 ENCOUNTER — Other Ambulatory Visit: Payer: Self-pay | Admitting: Internal Medicine

## 2024-03-13 ENCOUNTER — Ambulatory Visit: Payer: BC Managed Care – PPO | Admitting: Nurse Practitioner

## 2025-01-20 ENCOUNTER — Ambulatory Visit: Admitting: Internal Medicine
